# Patient Record
Sex: Female | Born: 2000
Health system: Southern US, Community
[De-identification: ages and names within clinical notes are randomized; demographics above are authoritative.]

## PROBLEM LIST (undated history)

## (undated) HISTORY — PX: LEG SURGERY: SHX1003

---
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
## 2018-11-04 ENCOUNTER — Emergency Department (HOSPITAL_COMMUNITY)
Admission: EM | Admit: 2018-11-04 | Discharge: 2018-11-04 | Disposition: A | Discharge status: Home / Self Care | Payer: Medicaid Other | Attending: Emergency Medicine | Admitting: Emergency Medicine

## 2018-11-04 ENCOUNTER — Encounter (HOSPITAL_COMMUNITY): Payer: Self-pay

## 2018-11-04 ENCOUNTER — Other Ambulatory Visit: Payer: Self-pay

## 2018-11-04 DIAGNOSIS — Y999 Unspecified external cause status: Secondary | ICD-10-CM | POA: Insufficient documentation

## 2018-11-04 DIAGNOSIS — Y939 Activity, unspecified: Secondary | ICD-10-CM | POA: Insufficient documentation

## 2018-11-04 DIAGNOSIS — F458 Other somatoform disorders: Secondary | ICD-10-CM | POA: Insufficient documentation

## 2018-11-04 DIAGNOSIS — Y929 Unspecified place or not applicable: Secondary | ICD-10-CM | POA: Insufficient documentation

## 2018-11-04 DIAGNOSIS — W450XXA Nail entering through skin, initial encounter: Secondary | ICD-10-CM | POA: Insufficient documentation

## 2018-11-04 DIAGNOSIS — S91331A Puncture wound without foreign body, right foot, initial encounter: Secondary | ICD-10-CM

## 2018-11-04 DIAGNOSIS — Z23 Encounter for immunization: Secondary | ICD-10-CM | POA: Insufficient documentation

## 2018-11-04 DIAGNOSIS — Z79899 Other long term (current) drug therapy: Secondary | ICD-10-CM | POA: Insufficient documentation

## 2018-11-04 MED ORDER — TETANUS-DIPHTH-ACELL PERTUSSIS 5-2.5-18.5 LF-MCG/0.5 IM SUSP
0.5000 mL | Freq: Once | INTRAMUSCULAR | Status: AC
  Administered 2018-11-04: 15:00:00 0.5 mL via INTRAMUSCULAR
  Filled 2018-11-04: qty 0.5

## 2018-11-04 NOTE — ED Provider Notes (Signed)
[facility]-EMERGENCY DEPT Provider Note   CSN: [id number] Arrival date & time: 11/04/18  1401     History   Chief Complaint Chief Complaint  Patient presents with  . stepped on nail    HPI Jolyne LoaSiyanna Settles is a 18 y.o. female.     HPI This previously well young female presents 3 days after sustaining an injury to her right foot. Patient denies chronic medical conditions, is unsure of her last tetanus update, but believes it may have been around the time of high school. 3 days ago patient stepped on a nail, which penetrated her shoe, and into the distal plantar aspect of her right foot. She had some soreness over the subsequent 2 days, but that has improved. She notes maybe she has had some jaw clenching yesterday, but has also improved today. No fever, no chills, no nausea, no vomiting, no other complaints. No medication taken for pain relief. History reviewed. No pertinent past medical history.  Patient Active Problem List   Diagnosis Date Noted  . Abnormal uterine bleeding (AUB) 10/14/2016    History reviewed. No pertinent surgical history.   OB History    Gravida  0   Para  0   Term  0   Preterm  0   AB  0   Living  0     SAB  0   TAB  0   Ectopic  0   Multiple  0   Live Births  0            Home Medications    Prior to Admission medications   Medication Sig Start Date End Date Taking? Authorizing Provider  cephALEXin (KEFLEX) 500 MG capsule Take 1 capsule (500 mg total) by mouth 4 (four) times daily. 12/17/17   Dietrich PatesKhatri, Hina, PA-C  ENSKYCE 0.15-30 MG-MCG tablet TAKE 1 TABLET BY MOUTH ONCE DAILY 10/19/17   Brock BadHarper, Charles A, MD  ibuprofen (ADVIL,MOTRIN) 600 MG tablet Take 1 tablet (600 mg total) by mouth every 6 (six) hours as needed for mild pain or moderate pain. 09/13/17   Loren RacerYelverton, David, MD    Family History History reviewed. No pertinent family history.  Social History Social History   Tobacco Use  . Smoking  status: Never Smoker  . Smokeless tobacco: Never Used  Substance Use Topics  . Alcohol use: Yes    Comment: socially  . Drug use: Yes    Types: Marijuana    Comment: denies current use     Allergies   Patient has no known allergies.   Review of Systems Review of Systems  Constitutional:       Per HPI, otherwise negative  HENT:       Per HPI, otherwise negative  Respiratory:       Per HPI, otherwise negative  Cardiovascular:       Per HPI, otherwise negative  Gastrointestinal: Negative for vomiting.  Endocrine:       Negative aside from HPI  Genitourinary:       Neg aside from HPI   Musculoskeletal:       Per HPI, otherwise negative  Skin: Negative.   Neurological: Negative for syncope.     Physical Exam Updated Vital Signs BP 121/69   Pulse 84   Temp 98.2 F (36.8 C) (Oral)   Resp 13   Ht 5\' 3"  (1.6 m)   Wt 49.9 kg   SpO2 100%   BMI 19.49 kg/m   Physical Exam Vitals signs  and nursing note reviewed.  Constitutional:      General: She is not in acute distress.    Appearance: She is well-developed.  HENT:     Head: Normocephalic and atraumatic.     Comments: No gross e/o lock-jaw Eyes:     Conjunctiva/sclera: Conjunctivae normal.  Cardiovascular:     Rate and Rhythm: Normal rate and regular rhythm.     Pulses: Normal pulses.  Pulmonary:     Effort: Pulmonary effort is normal.  Musculoskeletal:       Feet:  Skin:    General: Skin is warm and dry.  Neurological:     Mental Status: She is alert and oriented to person, place, and time.     Cranial Nerves: No cranial nerve deficit.      ED Treatments / Results   Procedures Procedures (including critical care time)  Medications Ordered in ED Medications  Tdap (BOOSTRIX) injection 0.5 mL (has no administration in time range)     Initial Impression / Assessment and Plan / ED Course  I have reviewed the triage vital signs and the nursing notes.  Pertinent labs & imaging results that were  available during my care of the patient were reviewed by me and considered in my medical decision making (see chart for details).  Healthy young female present several days after sustaining an injury to her right foot. Here no evidence for retained foreign body, no neurovascular impairment, no appreciable lesions.  Patient has no evidence for lockjaw, nor bacteremia, sepsis, cellulitis.  Given the duration of time, and absent worsening condition, superficial changes, no indication for antibiotics, though the patient did receive tetanus updating, and a conversation on return precautions.  Final Clinical Impressions(s) / ED Diagnoses   Final diagnoses:  Puncture wound of right foot, initial encounter     Carmin Muskrat, MD 11/04/18 1438

## 2018-11-04 NOTE — Discharge Instructions (Addendum)
As discussed, your evaluation today has been largely reassuring.  But, it is important that you monitor your condition carefully, and do not hesitate to return to the ED if you develop new, or concerning changes in your condition. ? ?Otherwise, please follow-up with your physician for appropriate ongoing care. ? ?

## 2018-11-04 NOTE — ED Triage Notes (Signed)
Stepped on rusty nail on Wednesday on right foot and now has jaw aching and wants tetanus shot states could be her anxiety.

## 2018-12-09 ENCOUNTER — Emergency Department (HOSPITAL_COMMUNITY)
Admission: EM | Admit: 2018-12-09 | Discharge: 2018-12-10 | Disposition: A | Discharge status: Home / Self Care | Payer: Medicaid Other | Attending: Emergency Medicine | Admitting: Emergency Medicine

## 2018-12-09 ENCOUNTER — Other Ambulatory Visit: Payer: Self-pay

## 2018-12-09 ENCOUNTER — Encounter (HOSPITAL_COMMUNITY): Payer: Self-pay | Admitting: Emergency Medicine

## 2018-12-09 DIAGNOSIS — Z5321 Procedure and treatment not carried out due to patient leaving prior to being seen by health care provider: Secondary | ICD-10-CM | POA: Insufficient documentation
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
## 2018-12-10 NOTE — ED Notes (Signed)
Pt called to be taken back into the room. No answer x2

## 2018-12-12 ENCOUNTER — Other Ambulatory Visit: Payer: Self-pay

## 2018-12-12 ENCOUNTER — Emergency Department (HOSPITAL_COMMUNITY)
Admission: EM | Admit: 2018-12-12 | Discharge: 2018-12-12 | Disposition: A | Discharge status: Home / Self Care | Payer: No Typology Code available for payment source | Attending: Emergency Medicine | Admitting: Emergency Medicine

## 2018-12-12 ENCOUNTER — Encounter (HOSPITAL_COMMUNITY): Payer: Self-pay

## 2018-12-12 ENCOUNTER — Emergency Department (HOSPITAL_COMMUNITY): Payer: No Typology Code available for payment source

## 2018-12-12 DIAGNOSIS — Y9389 Activity, other specified: Secondary | ICD-10-CM | POA: Insufficient documentation

## 2018-12-12 DIAGNOSIS — S161XXA Strain of muscle, fascia and tendon at neck level, initial encounter: Secondary | ICD-10-CM

## 2018-12-12 DIAGNOSIS — S199XXA Unspecified injury of neck, initial encounter: Secondary | ICD-10-CM | POA: Diagnosis present

## 2018-12-12 DIAGNOSIS — Y92410 Unspecified street and highway as the place of occurrence of the external cause: Secondary | ICD-10-CM | POA: Insufficient documentation

## 2018-12-12 DIAGNOSIS — M5441 Lumbago with sciatica, right side: Secondary | ICD-10-CM | POA: Diagnosis not present

## 2018-12-12 DIAGNOSIS — Y999 Unspecified external cause status: Secondary | ICD-10-CM | POA: Insufficient documentation

## 2018-12-12 MED ORDER — ACETAMINOPHEN 325 MG PO TABS
650.0000 mg | ORAL_TABLET | Freq: Once | ORAL | Status: AC
  Administered 2018-12-12: 650 mg via ORAL
  Filled 2018-12-12: qty 2

## 2018-12-12 MED ORDER — SODIUM CHLORIDE 0.9 % IV BOLUS (SEPSIS): 1000.0000 mL | Freq: Once | INTRAVENOUS | Status: DC

## 2018-12-12 MED ORDER — SODIUM CHLORIDE 0.9 % IV SOLN: 1000.0000 mL | INTRAVENOUS | Status: DC
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
## 2018-12-12 NOTE — Discharge Instructions (Addendum)
Take ibuprofen 3 times a day with meals.  Do not take other anti-inflammatories at the same time (Advil, Motrin, naproxen, Aleve). You may supplement with Tylenol if you need further pain control. Use muscle creams such as salonaps, icy hot, bengay, boifreeze.  Use ice packs or heating pads if this helps control your pain. Do the back stretches for your back.  You will likely have continued muscle stiffness and soreness over the next couple days.  Follow-up with primary care in 1 week if your symptoms are not improving. Return to the emergency room if you develop vision changes, vomiting, slurred speech, numbness, loss of bowel or bladder control, or any new or worsening symptoms.
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
## 2020-10-10 IMAGING — CT CT CERVICAL SPINE W/O CM
3 of 4 series · 12 of 35 positions shown, 14 images · non-contrast
Comparison: None.

CLINICAL DATA: Neck pain radiating to the right shoulder

EXAM:
CT CERVICAL SPINE WITHOUT CONTRAST
TECHNIQUE: Multidetector CT imaging of the cervical spine was performed without
intravenous contrast. Multiplanar CT image reconstructions were also
generated.

[Series 5: orthogonal bone · axial · 0.20mm/px · z∈[-323,-210]mm · 4 of 93 slices shown, 5 images]
[im 16/93  soft-tissue]
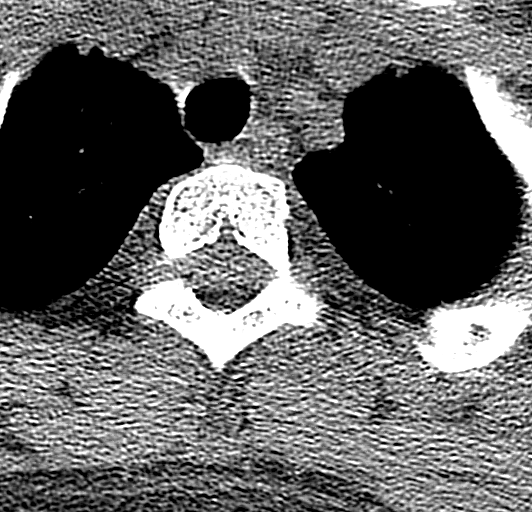
[im 16/93  bone]
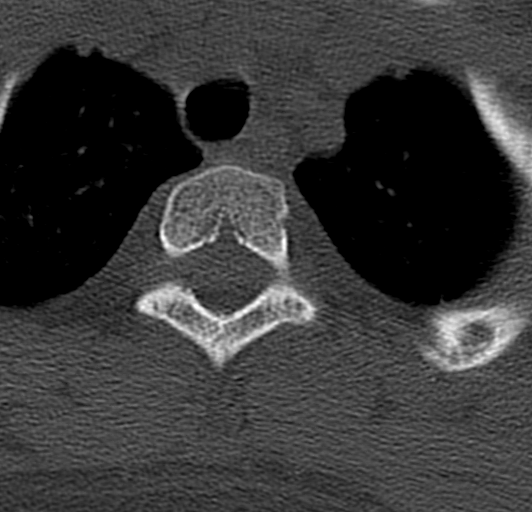
[im 31/93  bone]
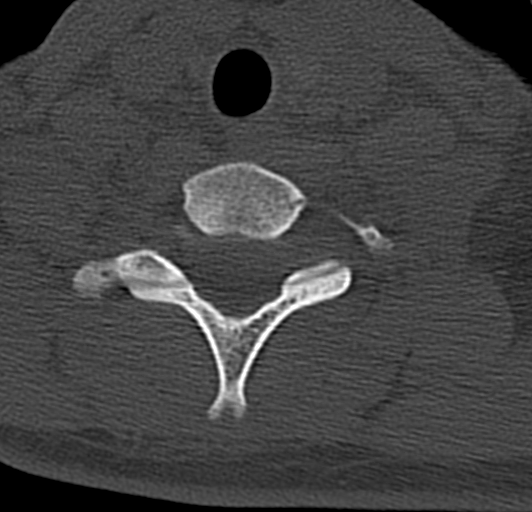
[im 62/93  bone]
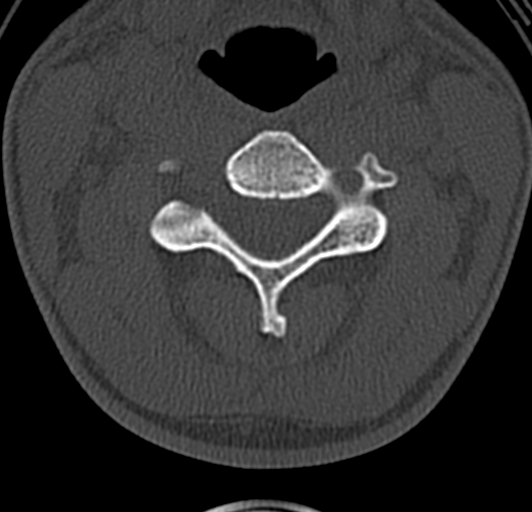
[im 77/93  bone]
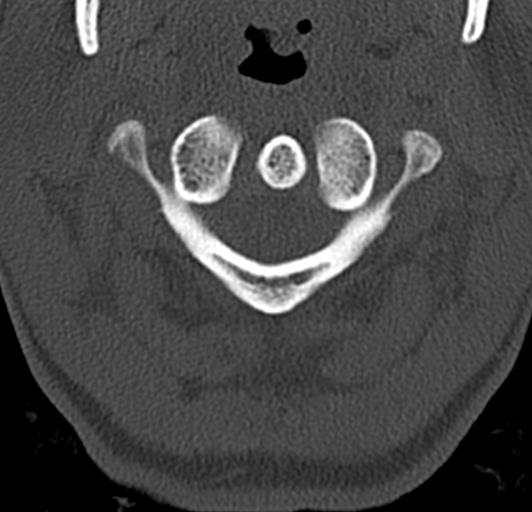

[Series 6: coronal bone · coronal · 0.19mm/px · 3 of 41 slices shown]
[im 9/41  bone]
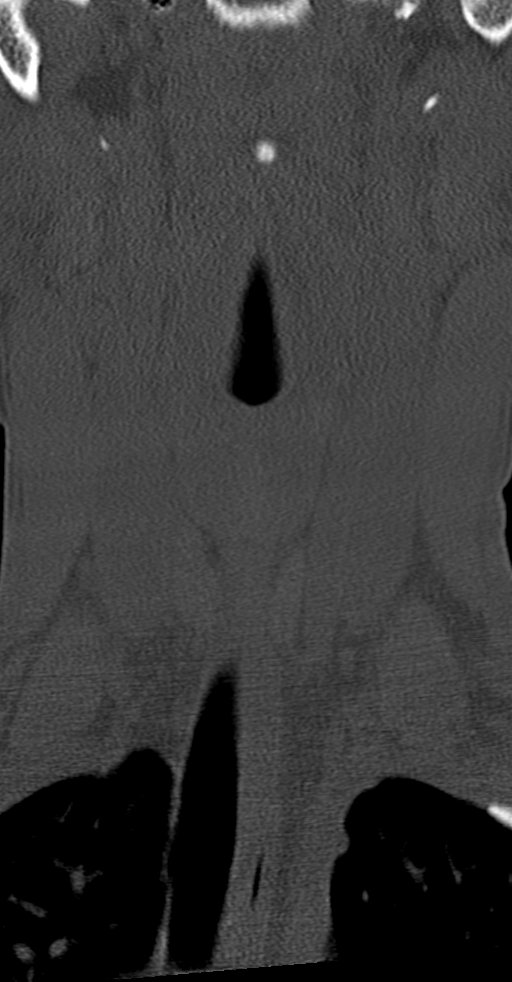
[im 17/41  bone]
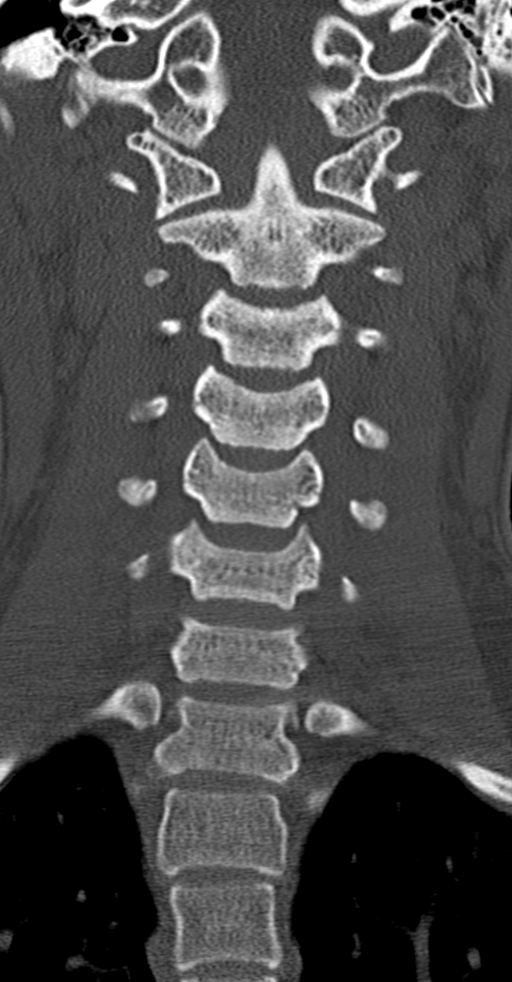
[im 25/41  bone]
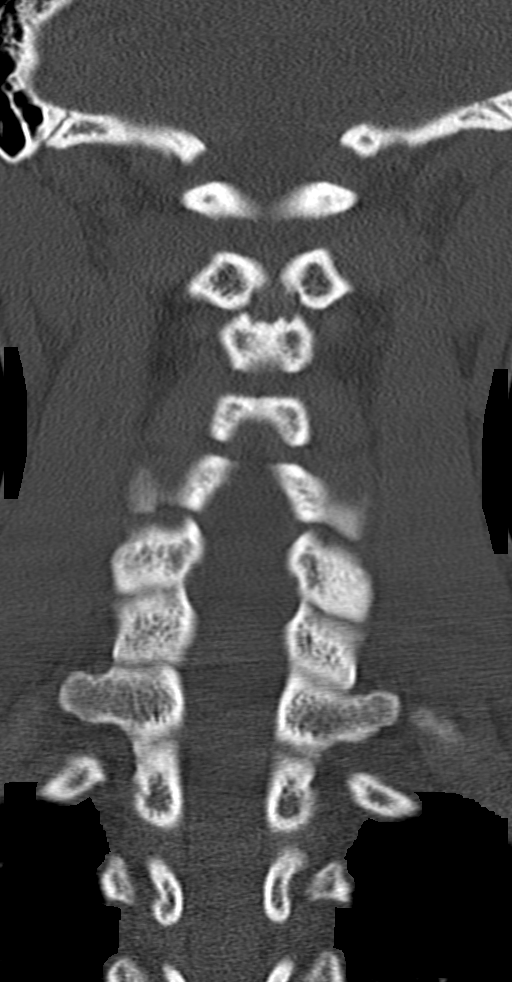

[Series 7: sagittal bone · sagittal · 0.28mm/px · 5 of 43 slices shown, 6 images]
[im 15/43  bone]
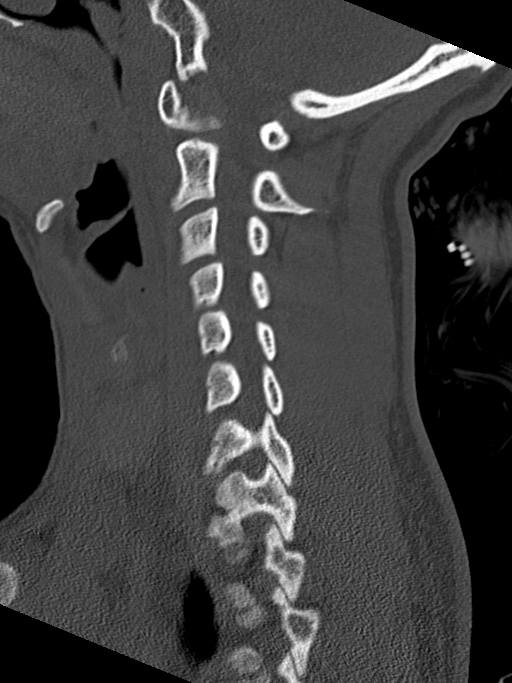
[im 18/43  bone]
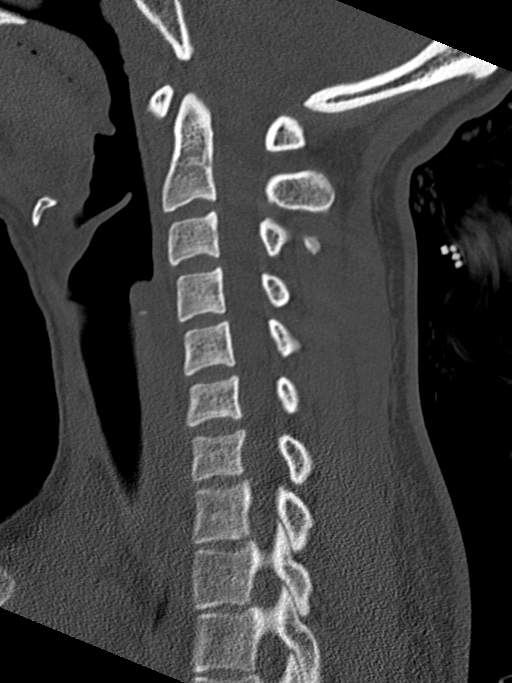
[im 22/43  soft-tissue]
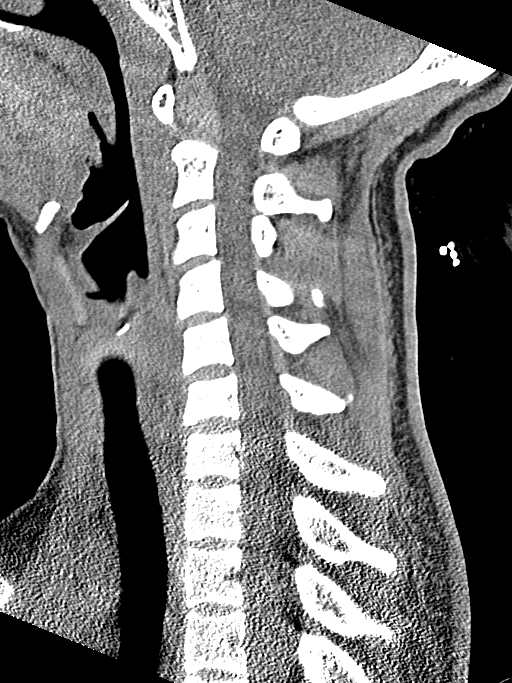
[im 22/43  bone]
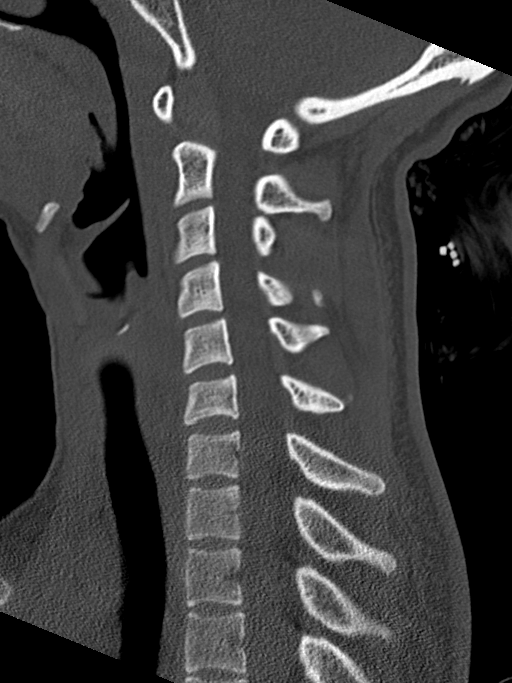
[im 25/43  bone]
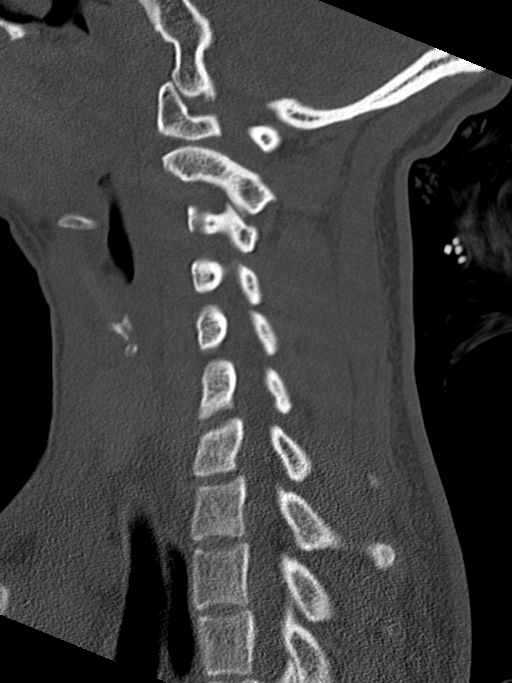
[im 29/43  bone]
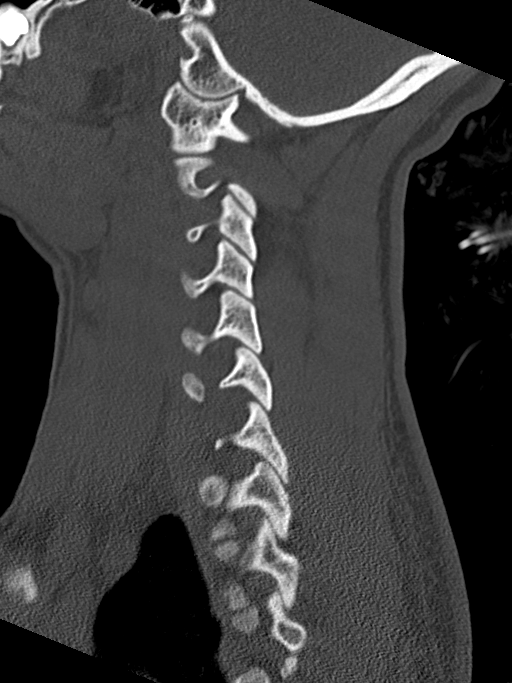

[12 of 35 positions shown; findings below may reference images not displayed]

FINDINGS: Alignment: No static subluxation. Facets are aligned. Occipital
condyles and the lateral masses of C1 and C2 are normally
approximated. Straightening of normal lordotic curvature may be due
to positioning or muscle spasm.

Skull base and vertebrae: No acute fracture.

Soft tissues and spinal canal: No prevertebral fluid or swelling. No
visible canal hematoma.

Disc levels: No advanced spinal canal or neural foraminal stenosis.

Upper chest: No pneumothorax, pulmonary nodule or pleural effusion.

Other: Normal visualized paraspinal cervical soft tissues.
IMPRESSION: Mild straightening of cervical lordosis but otherwise normal
cervical spine.

## 2022-02-13 ENCOUNTER — Emergency Department (HOSPITAL_BASED_OUTPATIENT_CLINIC_OR_DEPARTMENT_OTHER)
Admission: EM | Admit: 2022-02-13 | Discharge: 2022-02-14 | Disposition: A | Discharge status: Home / Self Care | Payer: No Typology Code available for payment source | Attending: Emergency Medicine | Admitting: Emergency Medicine

## 2022-02-13 ENCOUNTER — Encounter (HOSPITAL_BASED_OUTPATIENT_CLINIC_OR_DEPARTMENT_OTHER): Payer: Self-pay | Admitting: Emergency Medicine

## 2022-02-13 ENCOUNTER — Emergency Department (HOSPITAL_BASED_OUTPATIENT_CLINIC_OR_DEPARTMENT_OTHER): Payer: No Typology Code available for payment source

## 2022-02-13 DIAGNOSIS — R519 Headache, unspecified: Secondary | ICD-10-CM | POA: Diagnosis present

## 2022-02-13 DIAGNOSIS — Y92410 Unspecified street and highway as the place of occurrence of the external cause: Secondary | ICD-10-CM | POA: Insufficient documentation

## 2022-02-13 LAB — PREGNANCY, URINE: Preg Test, Ur: NEGATIVE

## 2022-02-13 MED ORDER — ACETAMINOPHEN 500 MG PO TABS
1000.0000 mg | ORAL_TABLET | Freq: Once | ORAL | Status: AC
  Administered 2022-02-14: 1000 mg via ORAL
  Filled 2022-02-13: qty 2
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
## 2022-02-14 MED ORDER — NAPROXEN 500 MG PO TABS: 500.0000 mg | ORAL_TABLET | Freq: Two times a day (BID) | ORAL | 0 refills | Status: AC

## 2022-02-14 MED ORDER — NAPROXEN 250 MG PO TABS
500.0000 mg | ORAL_TABLET | Freq: Once | ORAL | Status: AC
Start: 2022-02-14 — End: 2022-02-14
  Administered 2022-02-14: 500 mg via ORAL
  Filled 2022-02-14: qty 2
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
